# Patient Record
Sex: Male | Born: 1985 | Race: White | Hispanic: No | Marital: Single | Smoking: Current every day smoker
Health system: Southern US, Community
[De-identification: ages and names within clinical notes are randomized; demographics above are authoritative.]

---

## 2017-03-26 ENCOUNTER — Emergency Department (HOSPITAL_COMMUNITY)
Admission: EM | Admit: 2017-03-26 | Discharge: 2017-03-26 | Disposition: A | Discharge status: Home / Self Care | Payer: Self-pay | Attending: Emergency Medicine | Admitting: Emergency Medicine

## 2017-03-26 ENCOUNTER — Encounter (HOSPITAL_COMMUNITY): Payer: Self-pay | Admitting: Emergency Medicine

## 2017-03-26 ENCOUNTER — Emergency Department (HOSPITAL_COMMUNITY): Payer: Self-pay

## 2017-03-26 DIAGNOSIS — F10930 Alcohol use, unspecified with withdrawal, uncomplicated: Secondary | ICD-10-CM

## 2017-03-26 DIAGNOSIS — R569 Unspecified convulsions: Secondary | ICD-10-CM | POA: Insufficient documentation

## 2017-03-26 DIAGNOSIS — F10230 Alcohol dependence with withdrawal, uncomplicated: Secondary | ICD-10-CM | POA: Insufficient documentation

## 2017-03-26 DIAGNOSIS — F17210 Nicotine dependence, cigarettes, uncomplicated: Secondary | ICD-10-CM | POA: Insufficient documentation

## 2017-03-26 LAB — CBC WITH DIFFERENTIAL/PLATELET
BASOS ABS: 0 10*3/uL (ref 0.0–0.1)
Basophils Relative: 0 %
EOS ABS: 0 10*3/uL (ref 0.0–0.7)
EOS PCT: 0 %
HCT: 40.8 % (ref 39.0–52.0)
HEMOGLOBIN: 14.1 g/dL (ref 13.0–17.0)
LYMPHS ABS: 0.4 10*3/uL — AB (ref 0.7–4.0)
Lymphocytes Relative: 3 %
MCH: 35.1 pg — AB (ref 26.0–34.0)
MCHC: 34.6 g/dL (ref 30.0–36.0)
MCV: 101.5 fL — ABNORMAL HIGH (ref 78.0–100.0)
Monocytes Absolute: 1.1 10*3/uL — ABNORMAL HIGH (ref 0.1–1.0)
Monocytes Relative: 9 %
NEUTROS PCT: 88 %
Neutro Abs: 10.4 10*3/uL — ABNORMAL HIGH (ref 1.7–7.7)
PLATELETS: 229 10*3/uL (ref 150–400)
RBC: 4.02 MIL/uL — AB (ref 4.22–5.81)
RDW: 12.6 % (ref 11.5–15.5)
WBC: 11.9 10*3/uL — AB (ref 4.0–10.5)

## 2017-03-26 LAB — COMPREHENSIVE METABOLIC PANEL
ALK PHOS: 90 U/L (ref 38–126)
ALT: 113 U/L — ABNORMAL HIGH (ref 17–63)
ANION GAP: 14 (ref 5–15)
AST: 187 U/L — ABNORMAL HIGH (ref 15–41)
Albumin: 4.8 g/dL (ref 3.5–5.0)
BILIRUBIN TOTAL: 1.5 mg/dL — AB (ref 0.3–1.2)
CALCIUM: 9.9 mg/dL (ref 8.9–10.3)
CO2: 28 mmol/L (ref 22–32)
Chloride: 98 mmol/L — ABNORMAL LOW (ref 101–111)
Creatinine, Ser: 0.97 mg/dL (ref 0.61–1.24)
GFR calc Af Amer: >60 mL/min (ref >60)
Glucose, Bld: 118 mg/dL — ABNORMAL HIGH (ref 65–99)
POTASSIUM: 4.5 mmol/L (ref 3.5–5.1)
Sodium: 140 mmol/L (ref 135–145)
Total Protein: 8.7 g/dL — ABNORMAL HIGH (ref 6.5–8.1)

## 2017-03-26 LAB — RAPID URINE DRUG SCREEN, HOSP PERFORMED
Amphetamines: NOT DETECTED
Barbiturates: NOT DETECTED
Benzodiazepines: NOT DETECTED
Cocaine: NOT DETECTED
OPIATES: NOT DETECTED
TETRAHYDROCANNABINOL: NOT DETECTED

## 2017-03-26 LAB — ETHANOL

## 2017-03-26 LAB — LIPASE, BLOOD: LIPASE: 18 U/L (ref 11–51)

## 2017-03-26 LAB — SALICYLATE LEVEL

## 2017-03-26 LAB — ACETAMINOPHEN LEVEL: Acetaminophen (Tylenol), Serum: <10 ug/mL — ABNORMAL LOW (ref 10–30)

## 2017-03-26 MED ORDER — LORAZEPAM 1 MG PO TABS
1.0000 mg | ORAL_TABLET | Freq: Once | ORAL | Status: AC
  Administered 2017-03-26: 1 mg via ORAL
  Filled 2017-03-26: qty 1

## 2017-03-26 MED ORDER — SODIUM CHLORIDE 0.9 % IV BOLUS (SEPSIS)
1000.0000 mL | Freq: Once | INTRAVENOUS | Status: AC
  Administered 2017-03-26: 1000 mL via INTRAVENOUS

## 2017-03-26 MED ORDER — LORAZEPAM 2 MG/ML IJ SOLN
1.0000 mg | Freq: Once | INTRAMUSCULAR | Status: AC
  Administered 2017-03-26: 1 mg via INTRAVENOUS
  Filled 2017-03-26: qty 1

## 2017-03-26 MED ORDER — PANTOPRAZOLE SODIUM 40 MG IV SOLR
40.0000 mg | Freq: Once | INTRAVENOUS | Status: AC
  Administered 2017-03-26: 40 mg via INTRAVENOUS
  Filled 2017-03-26: qty 40

## 2017-03-26 MED ORDER — ONDANSETRON HCL 4 MG/2ML IJ SOLN
4.0000 mg | Freq: Once | INTRAMUSCULAR | Status: AC
  Administered 2017-03-26: 4 mg via INTRAVENOUS
  Filled 2017-03-26: qty 2

## 2017-03-26 NOTE — ED Provider Notes (Signed)
[facility]-EMERGENCY DEPT Provider Note   CSN: [id number] Arrival date & time: 03/26/17  0751     History   Chief Complaint Chief Complaint  Patient presents with  . Seizures  . Alcohol Intoxication    HPI Ruben Patton is a 31 y.o. male.  Patient is a 31 year old male who is currently homeless and alcoholic who presents with seizure activity. He states he drinks about a gallon of vodka a day. He hasn't been able to keep any alcohol down since about 8:00 last night due to vomiting. He states he's had 2 seizures in the interim. He has a history of alcohol withdrawal seizures. He is not on any medication for seizures. He denies any injury from the seizures. He states he has been vomiting some blood. He feels very shaky. He's having some upper abdominal pain. He denies any other recent illnesses. No fevers coughing or cold symptoms. He denies any drug use.      History reviewed. No pertinent past medical history.  There are no active problems to display for this patient.   History reviewed. No pertinent surgical history.     Home Medications    Prior to Admission medications   Not on File    Family History History reviewed. No pertinent family history.  Social History Social History  Substance Use Topics  . Smoking status: Current Every Day Smoker    Packs/day: 0.50    Types: Cigarettes  . Smokeless tobacco: Never Used  . Alcohol use Yes     Comment: 1/2 gallon vodak a day      Allergies   Patient has no known allergies.   Review of Systems Review of Systems  Constitutional: Positive for fatigue. Negative for chills, diaphoresis and fever.  HENT: Negative for congestion, rhinorrhea and sneezing.   Eyes: Negative.   Respiratory: Negative for cough, chest tightness and shortness of breath.   Cardiovascular: Negative for chest pain and leg swelling.  Gastrointestinal: Positive for abdominal pain, nausea and vomiting. Negative for blood in stool and diarrhea.    Genitourinary: Negative for difficulty urinating, flank pain, frequency and hematuria.  Musculoskeletal: Negative for arthralgias and back pain.  Skin: Negative for rash.  Neurological: Positive for tremors, seizures and headaches. Negative for dizziness, speech difficulty, weakness and numbness.  Psychiatric/Behavioral: The patient is nervous/anxious.      Physical Exam Updated Vital Signs BP (!) 163/97   Pulse 83   Temp 98.1 F (36.7 C) (Oral)   Resp (!) 21   Ht  (1.702 m)   Wt 160 lb (72.6 kg)   SpO2 100%   BMI 25.06 kg/m   Physical Exam  Constitutional: He is oriented to person, place, and time. He appears well-developed and well-nourished.  HENT:  Head: Normocephalic and atraumatic.  Eyes: Pupils are equal, round, and reactive to light.  Neck: Normal range of motion. Neck supple.  Cardiovascular: Regular rhythm and normal heart sounds.  Tachycardia present.   Pulmonary/Chest: Effort normal and breath sounds normal. No respiratory distress. He has no wheezes. He has no rales. He exhibits no tenderness.  Abdominal: Soft. Bowel sounds are normal. There is no tenderness. There is no rebound and no guarding.  Musculoskeletal: Normal range of motion. He exhibits no edema.  Lymphadenopathy:    He has no cervical adenopathy.  Neurological: He is alert and oriented to person, place, and time. He has normal strength. No cranial nerve deficit or sensory deficit.  Skin: Skin is warm and dry. No  rash noted.  Psychiatric: He has a normal mood and affect.     ED Treatments / Results  Labs (all labs ordered are listed, but only abnormal results are displayed) Labs Reviewed  COMPREHENSIVE METABOLIC PANEL - Abnormal; Notable for the following:       Result Value   Chloride 98 (*)    Glucose, Bld 118 (*)    BUN <5 (*)    Total Protein 8.7 (*)    AST 187 (*)    ALT 113 (*)    Total Bilirubin 1.5 (*)    All other components within normal limits  ACETAMINOPHEN LEVEL -  Abnormal; Notable for the following:    Acetaminophen (Tylenol), Serum <10 (*)    All other components within normal limits  CBC WITH DIFFERENTIAL/PLATELET - Abnormal; Notable for the following:    WBC 11.9 (*)    RBC 4.02 (*)    MCV 101.5 (*)    MCH 35.1 (*)    Neutro Abs 10.4 (*)    Lymphs Abs 0.4 (*)    Monocytes Absolute 1.1 (*)    All other components within normal limits  LIPASE, BLOOD  SALICYLATE LEVEL  RAPID URINE DRUG SCREEN, HOSP PERFORMED  ETHANOL  CBC WITH DIFFERENTIAL/PLATELET    EKG  EKG Interpretation  Date/Time:  Saturday March 26 2017 09:34:14 EDT Ventricular Rate:  91 PR Interval:    QRS Duration: 93 QT Interval:  392 QTC Calculation: 483 R Axis:   47 Text Interpretation:  Sinus rhythm Borderline prolonged QT interval No old tracing to compare Confirmed by Amelianna Meller  MD, Cannan Beeck ([id number]) on 03/26/2017 9:38:09 AM Also confirmed by Chantae Soo  MD, Karena Kinker ([id number]), editor Misty Stanley [phone])  on 03/26/2017 9:58:31 AM       Radiology Dg Chest 2 View  Result Date: 03/26/2017 CLINICAL DATA:  31 year old male with hemoptysis. EXAM: CHEST  2 VIEW COMPARISON:  None. FINDINGS: The cardiomediastinal silhouette is unremarkable. There is no evidence of focal airspace disease, pulmonary edema, suspicious pulmonary nodule/mass, pleural effusion, or pneumothorax. No acute bony abnormalities are identified. IMPRESSION: No active cardiopulmonary disease. Electronically Signed   By: Harmon Pier M.D.   On: 03/26/2017 14:17    Procedures Procedures (including critical care time)  Medications Ordered in ED Medications  sodium chloride 0.9 % bolus 1,000 mL (0 mLs Intravenous Stopped 03/26/17 0956)  ondansetron (ZOFRAN) injection 4 mg (4 mg Intravenous Given 03/26/17 0855)  LORazepam (ATIVAN) injection 1 mg (1 mg Intravenous Given 03/26/17 0855)  pantoprazole (PROTONIX) injection 40 mg (40 mg Intravenous Given 03/26/17 1001)  LORazepam (ATIVAN) injection 1 mg (1 mg Intravenous  Given 03/26/17 1100)  sodium chloride 0.9 % bolus 1,000 mL (0 mLs Intravenous Stopped 03/26/17 1209)  LORazepam (ATIVAN) tablet 1 mg (1 mg Oral Given 03/26/17 1209)  ondansetron (ZOFRAN) injection 4 mg (4 mg Intravenous Given 03/26/17 1320)     Initial Impression / Assessment and Plan / ED Course  I have reviewed the triage vital signs and the nursing notes.  Pertinent labs & imaging results that were available during my care of the patient were reviewed by me and considered in my medical decision making (see chart for details).     Patient presents with alcohol withdrawal and gastritis. He's had no bloody emesis in the ED. He's had no seizure activity after an extended observation period in the ED. His tachycardia has resolved. He's able to ambulate without ataxia. He has no confusion or hallucinations. His chair has improved. He's had  2 doses of Ativan in the ED. He did have one oral dose but he threw that up. He has been able tolerate oral fluids following that. He did report some shortness of breath on ambulation but his lungs are clear. He has a little bit of a cough but his chest x-ray shows no evidence of pneumonia. They checked his pulse ox on ambulation and he has an oxygen saturation of 90% with no tachycardia and no tachypnea. He was discharged home in good condition. He was not started on Librium because he doesn't have indication that he is going to stop drinking. He was given resources for outpatient substance abuse treatment. Return precautions were given.  Final Clinical Impressions(s) / ED Diagnoses   Final diagnoses:  Alcohol withdrawal syndrome without complication (HCC)  Seizure (HCC)    New Prescriptions New Prescriptions   No medications on file     Rolan Bucco, MD 03/26/17 1426

## 2017-03-26 NOTE — ED Notes (Signed)
Patient transported to X-ray 

## 2017-03-26 NOTE — ED Triage Notes (Addendum)
Patient has a history of seizures.  He has been drinking since yesterday and stopped last night around 8 pm.  He has n/v.  Patient had 2 seizures this morning, which were witnessed around 7 am today.  Denies hitting head.  Patient was not postictal with EMS.  Patient is A & O x4.    Patient reports no history of seizures, only when drinking  BP: 136/100 HR: 74 R: 16 98% on room air   EMS: administered 300 ml of fluid    20 G in left forearm  CBG: 148

## 2017-03-26 NOTE — ED Notes (Signed)
MADE REQUEST FOR URINE SAMPLE

## 2017-03-26 NOTE — ED Notes (Signed)
Patient walked about 71ft and became to short of breath and to tired to continue

## 2017-11-14 IMAGING — CR DG CHEST 2V
2 series · 2 of 2 positions shown · non-contrast
Comparison: None.

CLINICAL DATA: 30-year-old male with hemoptysis.

EXAM:
CHEST  2 VIEW

[w chest lat]
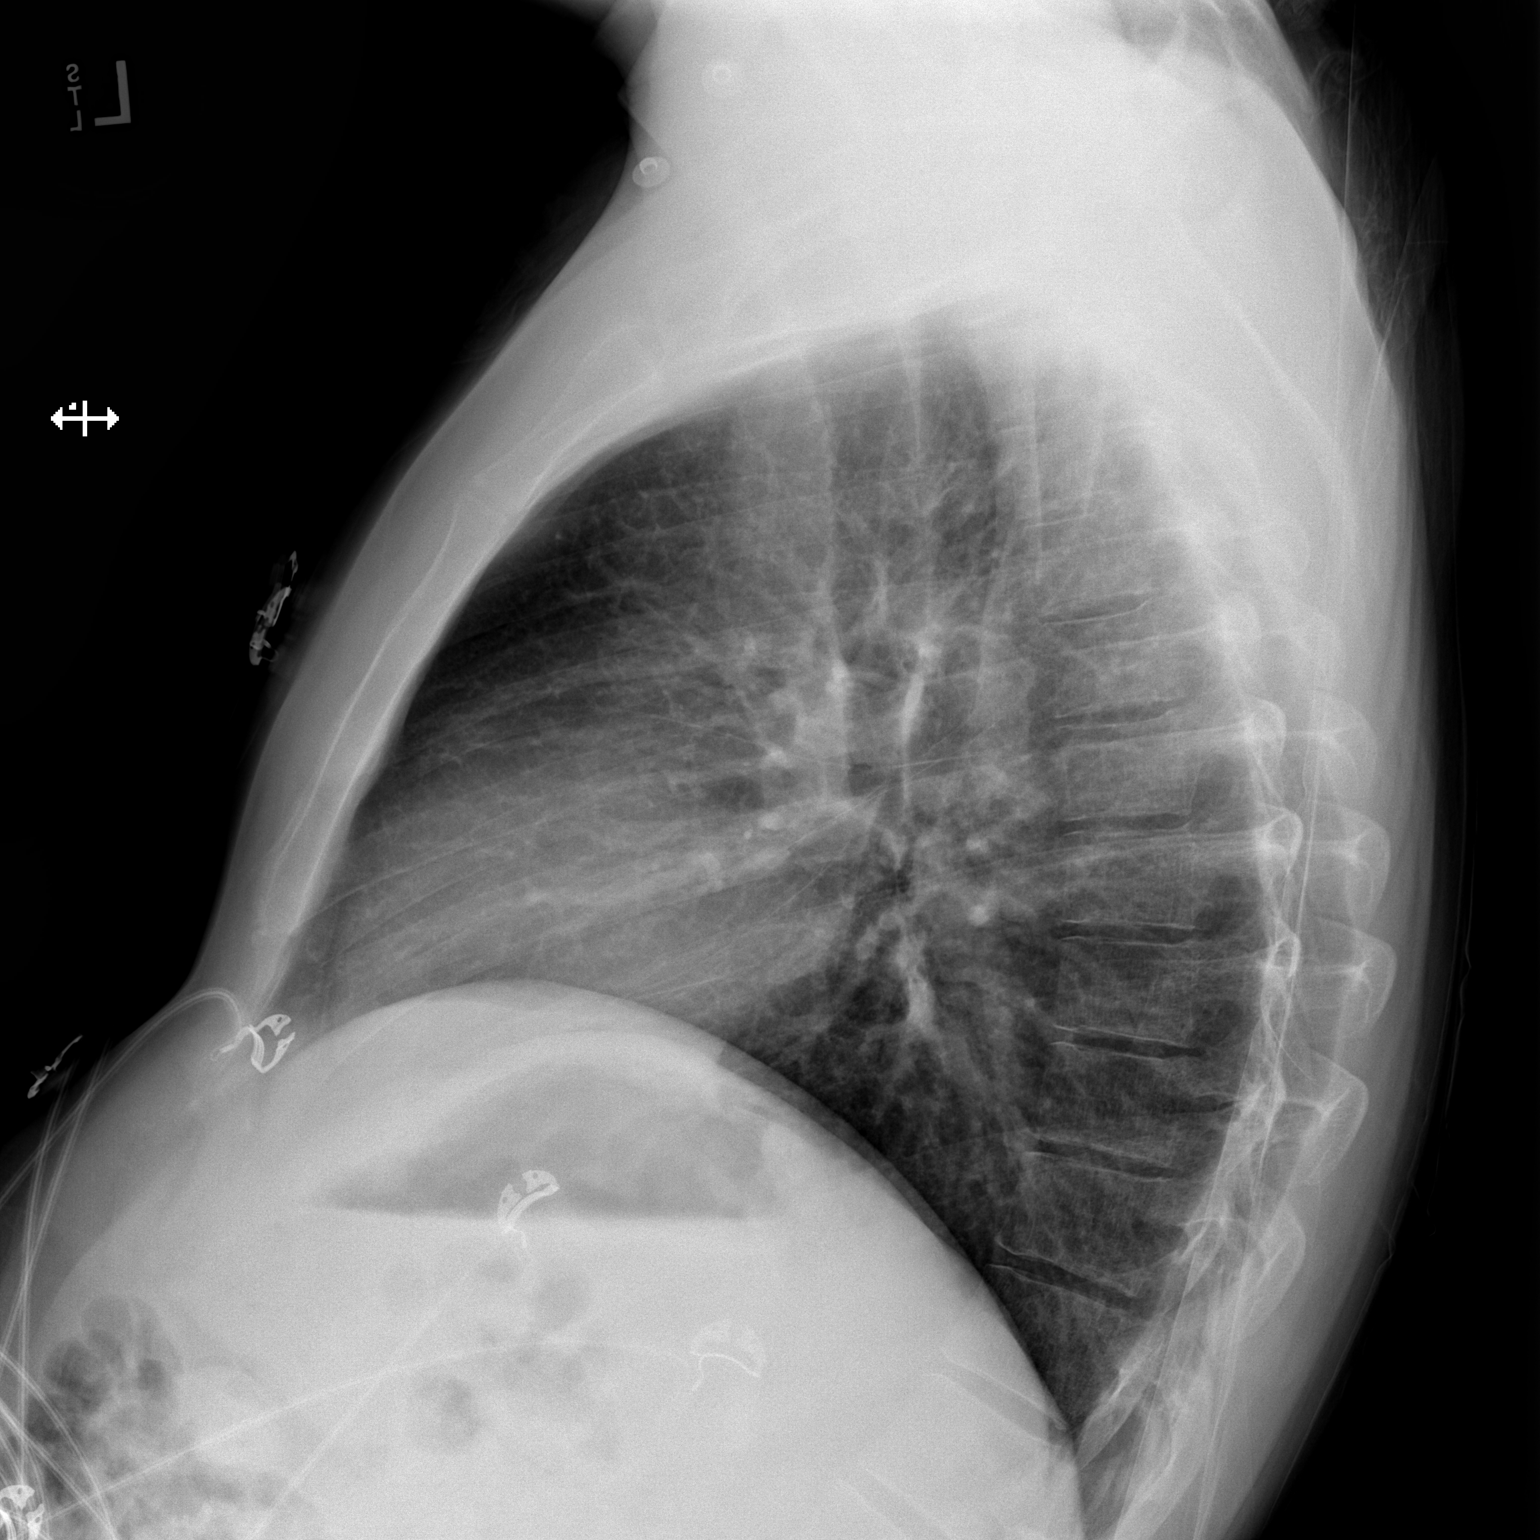

[x chest ap]
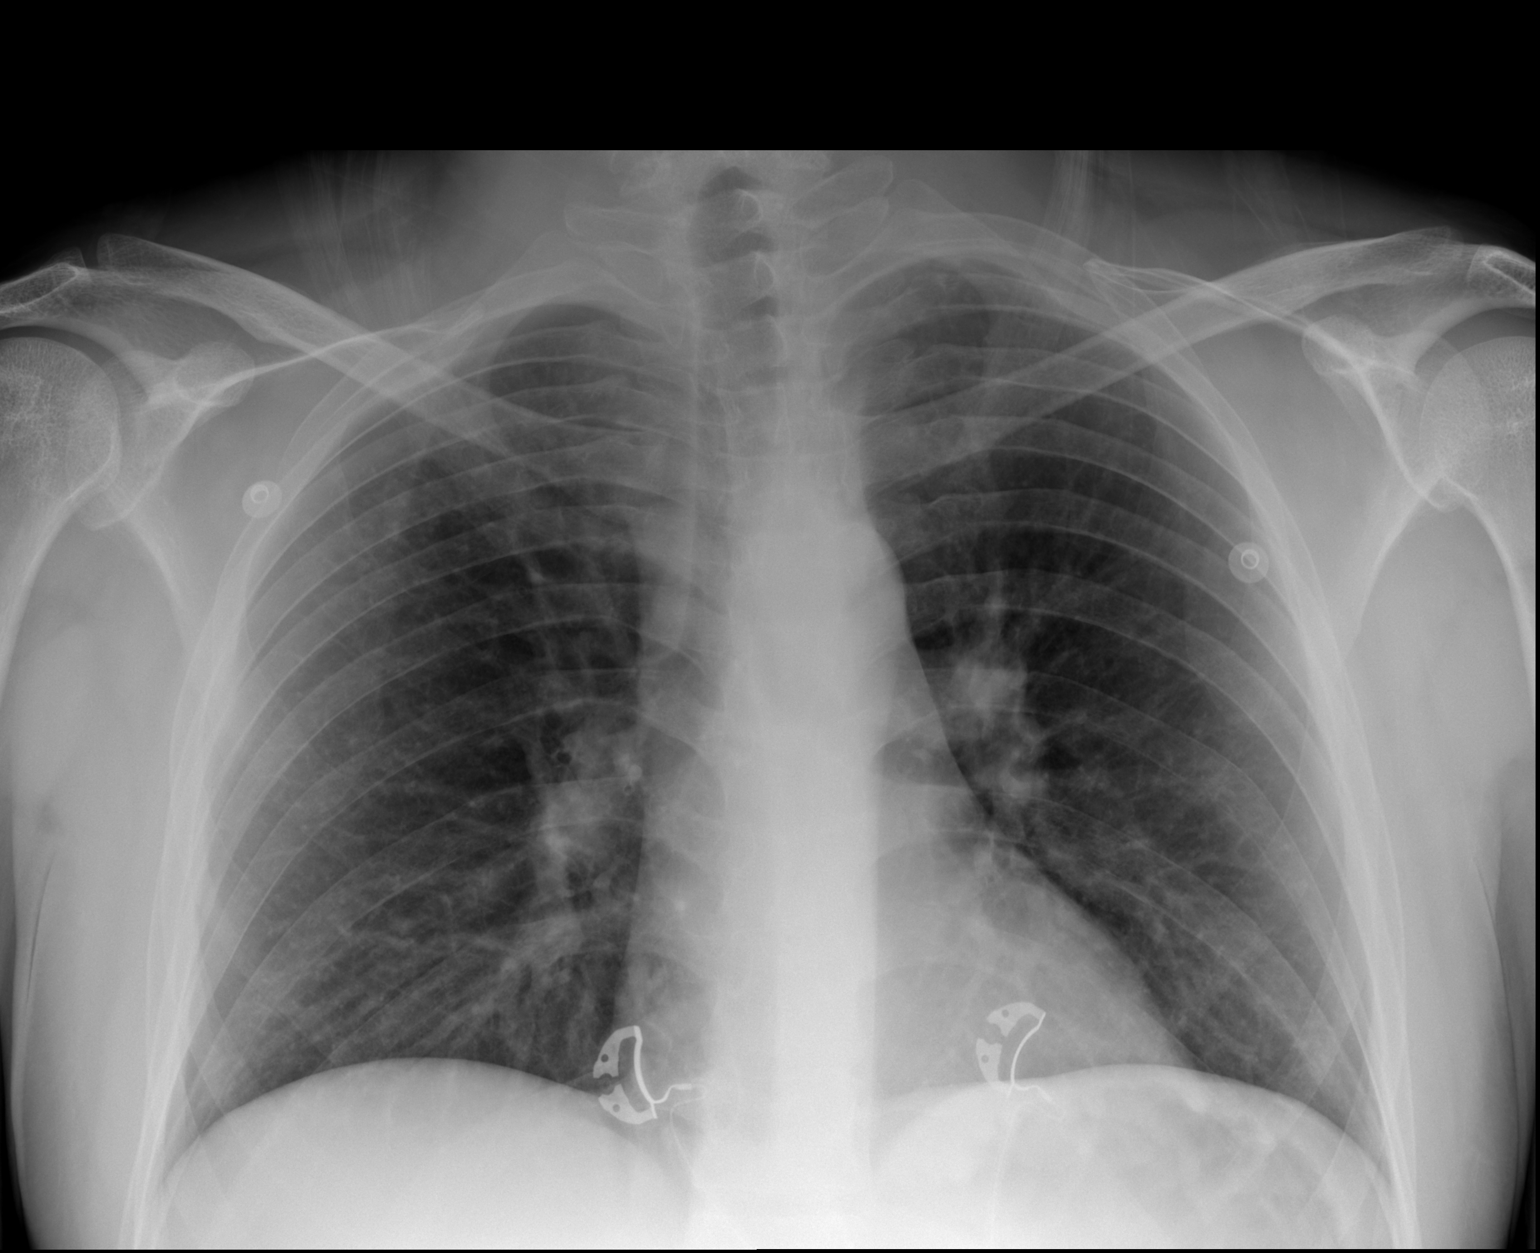

[2 of 2 positions shown; findings below may reference images not displayed]

FINDINGS: The cardiomediastinal silhouette is unremarkable.

There is no evidence of focal airspace disease, pulmonary edema,
suspicious pulmonary nodule/mass, pleural effusion, or pneumothorax.
No acute bony abnormalities are identified.
IMPRESSION: No active cardiopulmonary disease.

## 2018-01-16 ENCOUNTER — Emergency Department (INDEPENDENT_AMBULATORY_CARE_PROVIDER_SITE_OTHER): Payer: Self-pay | Admitting: Emergency Medicine

## 2018-01-16 ENCOUNTER — Encounter (INDEPENDENT_AMBULATORY_CARE_PROVIDER_SITE_OTHER): Payer: Self-pay

## 2018-01-16 VITALS — BP 124/88 | HR 89 | Temp 98.2°F | Resp 16 | Ht 67.0 in | Wt 160.0 lb

## 2018-01-16 DIAGNOSIS — K029 Dental caries, unspecified: Secondary | ICD-10-CM

## 2018-01-16 DIAGNOSIS — K047 Periapical abscess without sinus: Principal | ICD-10-CM

## 2018-01-16 MED ORDER — CHLORHEXIDINE GLUCONATE 0.12 % MT SOLN
15.00 mL | Freq: Two times a day (BID) | OROMUCOSAL | 0 refills | Status: AC
Start: 2018-01-16 — End: 2018-01-30

## 2018-01-16 MED ORDER — CLINDAMYCIN HCL 300 MG OR CAPS
300.00 mg | ORAL_CAPSULE | Freq: Three times a day (TID) | ORAL | 0 refills | Status: AC
Start: 2018-01-16 — End: 2018-01-23

## 2018-01-16 NOTE — Interdisciplinary (Signed)
Name/DOB verified. Pt roomed and intake done.

## 2018-01-16 NOTE — Progress Notes (Signed)
Subjective:   Frederick PinksRobert Shane Lohn is a 32 year old male who is here for Abscess - Simple (in mouth x2d) and Ear Pain (L ear pain x2d)      31 YO M, no medical problems, homeless, presents to the ED with a complaint of dental pain that began 3 days ago. The pain is in his left upper jaw. It extends to the left ear. He has not had fevers. +swelling. Pain with chewing. No difficulty swallowing or breathing. No throat swelling. The patient states that he took some antibiotics that were given to his friend for a similar infection and this has helped the swelling somewhat. No CP, SOb or abd pain. NO n/v/c/d or dysuria        Issues discussed today:  Dental pain    Review of Systems   Constitutional: Negative for chills and fever.   HENT: Positive for dental problem and facial swelling. Negative for congestion, ear discharge, sinus pressure, sore throat, trouble swallowing and voice change.    Respiratory: Negative for shortness of breath.    Cardiovascular: Negative for chest pain.   Gastrointestinal: Negative for abdominal distention and abdominal pain.   Genitourinary: Negative for dysuria.   Musculoskeletal: Negative for back pain.   Skin: Negative for rash.   Neurological: Negative for dizziness and headaches.   Psychiatric/Behavioral: Negative for confusion.        Current Outpatient Medications   Medication Sig Dispense Refill    chlorhexidine (PERIDEX) 0.12 % solution 15 mL by Mouth/Throat route 2 times daily for 14 days. 1 bottle 0    clindamycin (CLEOCIN) 300 MG capsule Take 1 capsule (300 mg) by mouth 3 times daily for 7 days. 21 capsule 0     No current facility-administered medications for this visit.      No Known Allergies    Reviewed patients pertinent information related to social history, past medical, past surgical, and family history.     Objective:  Vital signs: BP 124/88 (BP Location: Left arm, BP Patient Position: Sitting, BP cuff size: Regular)    Pulse 89    Temp 98.2 F (36.8 C) (Temporal)     Resp 16    Ht 5\' 7"  (1.702 m)    Wt 72.6 kg (160 lb)    SpO2 100%    BMI 25.06 kg/m     Physical Exam   Constitutional: He is oriented to person, place, and time. He appears well-developed and well-nourished. No distress.   HENT:   Head: Normocephalic and atraumatic.   Right Ear: External ear normal.   Left Ear: External ear normal.   Nose: Nose normal.   TTP of the left maxillary teeth. Has multiple dental caries and areas of gross decay. No fluctuant areas. There is mild edema over the left zygoma.    Eyes: Conjunctivae are normal. Pupils are equal, round, and reactive to light. Right eye exhibits no discharge. Left eye exhibits no discharge. No scleral icterus.   Neck: Neck supple. No thyromegaly present.   Cardiovascular: Normal rate.   No murmur heard.  Pulmonary/Chest: Effort normal and breath sounds normal. No respiratory distress.   Musculoskeletal: Normal range of motion.   Neurological: He is alert and oriented to person, place, and time.   Skin: No rash noted. He is not diaphoretic.       Assessment/Plan:  Frederick Hoover was seen today for Abscess - Simple (in mouth x2d) and Ear Pain (L ear pain x2d)  The patient presents with likely apical abscess and multiple dental caries. He has no concerning vital sign abnormalities or drainable lesions. Will start on clindamycin and peridex. Will refer to the free dental clinic. I have given him return precautions in the case that his symptoms worsen.     Diagnosed with:    ICD-10-CM ICD-9-CM    1. Apical abscess K04.7 522.5 clindamycin (CLEOCIN) 300 MG capsule   2. Dental caries K02.9 521.00 clindamycin (CLEOCIN) 300 MG capsule      chlorhexidine (PERIDEX) 0.12 % solution     No orders of the defined types were placed in this encounter.

## 2018-01-16 NOTE — Patient Instructions (Addendum)
Dental Caries    You have been seen for dental caries or cavities.    Dental caries are common. They are caused by loss of tooth enamel and decay of the tooth.    You may feel pain when drinking hot liquids, eating sweets, or chewing.    You will be provided with pain medication. Later, you should follow up with a dentist. To help prevent more dental caries, it is important to practice good dental care. This includes brushing your teeth often, using dental floss, and seeing your dentist regularly.    It is VERY IMPORTANT to follow up with a dentist. We recommend you see a dentist within 2 to 3 days.    YOU SHOULD BE SEEN BY A DENTIST, OR RETURN HERE OR GO TO THE NEAREST EMERGENCY DEPARTMENT IMMEDIATELY IF ANY OF THE FOLLOWING OCCURS:   Your pain gets worse.   You develop swelling of your face, under your tongue, or in your throat.   You develop fever (temperature higher than 100.26F / 38C) with dental pain.    Emergency and Urgent Care physicians are not dentists. Emergency department or urgent care treatment IS NOT A SUBSTITUTE for treatment by a dentist. You need to follow up with your dentist immediately and arrange to see a dentist on a regular basis.             Please call [facility]:    [phone]    [address]  [place] [phone]

## 2020-03-12 ENCOUNTER — Encounter (INDEPENDENT_AMBULATORY_CARE_PROVIDER_SITE_OTHER): Payer: Self-pay
